# Patient Record
Sex: Male | Born: 1995 | Race: White | Hispanic: No | Marital: Married | Smoking: Former smoker
Health system: Southern US, Community
[De-identification: ages and names within clinical notes are randomized; demographics above are authoritative.]

## PROBLEM LIST (undated history)

## (undated) DIAGNOSIS — K219 Gastro-esophageal reflux disease without esophagitis: Secondary | ICD-10-CM

## (undated) HISTORY — DX: Gastro-esophageal reflux disease without esophagitis: K21.9

---
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
## 2019-02-01 ENCOUNTER — Other Ambulatory Visit: Payer: Self-pay

## 2019-02-01 ENCOUNTER — Emergency Department
Admission: EM | Admit: 2019-02-01 | Discharge: 2019-02-01 | Disposition: A | Discharge status: Home / Self Care | Payer: Self-pay | Attending: Emergency Medicine | Admitting: Emergency Medicine

## 2019-02-01 DIAGNOSIS — Z5321 Procedure and treatment not carried out due to patient leaving prior to being seen by health care provider: Secondary | ICD-10-CM | POA: Insufficient documentation

## 2019-02-01 DIAGNOSIS — M7918 Myalgia, other site: Secondary | ICD-10-CM | POA: Insufficient documentation
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
## 2021-09-24 ENCOUNTER — Ambulatory Visit (INDEPENDENT_AMBULATORY_CARE_PROVIDER_SITE_OTHER): Payer: Managed Care, Other (non HMO)

## 2021-09-24 ENCOUNTER — Ambulatory Visit
Admission: RE | Admit: 2021-09-24 | Discharge: 2021-09-24 | Disposition: A | Discharge status: Home / Self Care | Payer: Managed Care, Other (non HMO) | Source: Ambulatory Visit | Attending: Physician Assistant | Admitting: Physician Assistant

## 2021-09-24 VITALS — BP 126/90 | HR 85 | Temp 98.6°F | Ht 73.0 in | Wt 220.0 lb

## 2021-09-24 DIAGNOSIS — J029 Acute pharyngitis, unspecified: Secondary | ICD-10-CM | POA: Diagnosis not present

## 2021-09-24 DIAGNOSIS — R059 Cough, unspecified: Secondary | ICD-10-CM

## 2021-09-24 DIAGNOSIS — R051 Acute cough: Secondary | ICD-10-CM

## 2021-09-24 DIAGNOSIS — J209 Acute bronchitis, unspecified: Secondary | ICD-10-CM | POA: Diagnosis not present

## 2021-09-24 LAB — GROUP A STREP BY PCR: Group A Strep by PCR: NOT DETECTED

## 2021-09-24 MED ORDER — PREDNISONE 20 MG PO TABS: 40.0000 mg | ORAL_TABLET | Freq: Every day | ORAL | 0 refills | Status: AC

## 2021-09-24 NOTE — ED Provider Notes (Signed)
[facility]    CSN: [id number] Arrival date & time: 09/24/21  1548      History   Chief Complaint Chief Complaint  Patient presents with   Sore Throat    HPI Robert Lopez is a 26 y.o. male presenting for approximately 2-week history of productive cough.  He denies associated fever, chest pain or shortness of breath.  Denies nasal congestion.  States that he woke up today with a sore throat.  He says that bronchitis has been going around his workplace.  He has taken DayQuil today but no other medicines.  He is a current every day smoker.  No other complaints.  HPI  History reviewed. No pertinent past medical history.  There are no problems to display for this patient.   History reviewed. No pertinent surgical history.     Home Medications    Prior to Admission medications   Medication Sig Start Date End Date Taking? Authorizing Provider  predniSONE (DELTASONE) 20 MG tablet Take 2 tablets (40 mg total) by mouth daily for 5 days. 09/24/21 09/29/21 Yes Shirlee Latch, PA-C    Family History History reviewed. No pertinent family history.  Social History Social History   Tobacco Use   Smoking status: Every Day    Types: Cigarettes   Smokeless tobacco: Never  Vaping Use   Vaping Use: Never used  Substance Use Topics   Alcohol use: Yes   Drug use: Never     Allergies   Ibuprofen   Review of Systems Review of Systems  Constitutional:  Negative for fatigue and fever.  HENT:  Positive for congestion and sore throat. Negative for rhinorrhea, sinus pressure and sinus pain.   Respiratory:  Positive for cough. Negative for shortness of breath.   Gastrointestinal:  Negative for abdominal pain, diarrhea, nausea and vomiting.  Musculoskeletal:  Negative for myalgias.  Neurological:  Negative for weakness, light-headedness and headaches.  Hematological:  Negative for adenopathy.     Physical Exam Triage Vital Signs ED Triage Vitals  Enc Vitals  Group     BP 09/24/21 1604 126/90     Pulse Rate 09/24/21 1604 85     Resp --      Temp 09/24/21 1604 98.6 F (37 C)     Temp Source 09/24/21 1604 Oral     SpO2 09/24/21 1604 95 %     Weight 09/24/21 1602 220 lb (99.8 kg)     Height 09/24/21 1602 6\' 1"  (1.854 m)     Head Circumference --      Peak Flow --      Pain Score 09/24/21 1602 4     Pain Loc --      Pain Edu? --      Excl. in [place]? --    No data found.  Updated Vital Signs BP 126/90 (BP Location: Left Arm)   Pulse 85   Temp 98.6 F (37 C) (Oral)   Ht 6\' 1"  (1.854 m)   Wt 220 lb (99.8 kg)   SpO2 95%   BMI 29.03 kg/m      Physical Exam Vitals and nursing note reviewed.  Constitutional:      General: He is not in acute distress.    Appearance: Normal appearance. He is well-developed. He is not ill-appearing.  HENT:     Head: Normocephalic and atraumatic.     Nose: Nose normal.     Mouth/Throat:     Mouth: Mucous membranes are moist.  Pharynx: Oropharynx is clear. Posterior oropharyngeal erythema present.  Eyes:     General: No scleral icterus.    Conjunctiva/sclera: Conjunctivae normal.  Cardiovascular:     Rate and Rhythm: Normal rate and regular rhythm.     Heart sounds: Normal heart sounds.  Pulmonary:     Effort: Pulmonary effort is normal. No respiratory distress.     Breath sounds: Wheezing and rhonchi present.     Comments: Wheezing and rhonchi throughout right lower and middle lung fields. Musculoskeletal:     Cervical back: Neck supple.  Skin:    General: Skin is warm and dry.     Capillary Refill: Capillary refill takes less than 2 seconds.  Neurological:     General: No focal deficit present.     Mental Status: He is alert. Mental status is at baseline.     Motor: No weakness.     Coordination: Coordination normal.  Psychiatric:        Mood and Affect: Mood normal.        Behavior: Behavior normal.      UC Treatments / Results  Labs (all labs ordered are listed, but only abnormal  results are displayed) Labs Reviewed  GROUP A STREP BY PCR    EKG   Radiology DG Chest 2 View  Result Date: 09/24/2021 CLINICAL DATA:  Cough for 2 weeks. EXAM: CHEST - 2 VIEW COMPARISON:  Chest two views 10/28/2008 FINDINGS: Cardiac silhouette and mediastinal contours are within normal limits. The lungs are clear. No pleural effusion or pneumothorax. No acute skeletal abnormality. IMPRESSION: No active cardiopulmonary disease. Electronically Signed   By: Neita Garnet M.D.   On: 09/24/2021 17:08    Procedures Procedures (including critical care time)  Medications Ordered in UC Medications - No data to display  Initial Impression / Assessment and Plan / UC Course  I have reviewed the triage vital signs and the nursing notes.  Pertinent labs & imaging results that were available during my care of the patient were reviewed by me and considered in my medical decision making (see chart for details).  26 year old male presenting for productive cough for the past 2 weeks.  Reports sore throat that began today.  Vitals normal and stable.  Patient is overall well-appearing.  On exam he has erythema posterior pharynx that is mild and wheezing and rhonchi throughout right middle and lower lung fields.  No respiratory distress.  PCR strep test performed and negative.  Chest x-ray obtained to assess for possible underlying pneumonia.  X-ray negative.  Discussed result with patient.  Advised him he likely has bronchitis which is generally due to a virus.  We discussed supportive care and him starting Mucinex and increasing fluid intake.  Sent prednisone to pharmacy.  Reviewed return precautions.   Final Clinical Impressions(s) / UC Diagnoses   Final diagnoses:  Acute bronchitis, unspecified organism  Sore throat  Acute cough     Discharge Instructions      -Negative strep - X-ray does not show any evidence of pneumonia.  Exam consistent with bronchitis.  I have advised to start using  Mucinex over-the-counter and increase fluid intake.  I sent prednisone to help as well.  You should be feeling better over the next week but if you develop fever or start to feel short of breath you should return for reevaluation.     ED Prescriptions     Medication Sig Dispense Auth. Provider   predniSONE (DELTASONE) 20 MG tablet Take 2 tablets (  40 mg total) by mouth daily for 5 days. 10 tablet Gareth Morgan      PDMP not reviewed this encounter.   Shirlee Latch, PA-C 09/24/21 1735

## 2021-09-24 NOTE — Discharge Instructions (Addendum)
-  Negative strep - X-ray does not show any evidence of pneumonia.  Exam consistent with bronchitis.  I have advised to start using Mucinex over-the-counter and increase fluid intake.  I sent prednisone to help as well.  You should be feeling better over the next week but if you develop fever or start to feel short of breath you should return for reevaluation.

## 2021-09-24 NOTE — ED Triage Notes (Addendum)
Patient presents to UC for sore throat this AM. Patient denies any fevers.  Patient stated that a lot of people at his work has bronchitis.

## 2021-12-18 ENCOUNTER — Encounter: Payer: Self-pay | Admitting: Emergency Medicine

## 2021-12-18 ENCOUNTER — Ambulatory Visit (INDEPENDENT_AMBULATORY_CARE_PROVIDER_SITE_OTHER): Payer: Managed Care, Other (non HMO)

## 2021-12-18 ENCOUNTER — Ambulatory Visit
Admission: EM | Admit: 2021-12-18 | Discharge: 2021-12-18 | Disposition: A | Discharge status: Home / Self Care | Payer: Managed Care, Other (non HMO) | Attending: Physician Assistant | Admitting: Physician Assistant

## 2021-12-18 ENCOUNTER — Other Ambulatory Visit: Payer: Self-pay

## 2021-12-18 DIAGNOSIS — M25571 Pain in right ankle and joints of right foot: Secondary | ICD-10-CM

## 2021-12-18 DIAGNOSIS — S99911A Unspecified injury of right ankle, initial encounter: Secondary | ICD-10-CM

## 2021-12-18 DIAGNOSIS — S99921A Unspecified injury of right foot, initial encounter: Secondary | ICD-10-CM | POA: Diagnosis not present

## 2021-12-18 DIAGNOSIS — M79671 Pain in right foot: Secondary | ICD-10-CM

## 2021-12-18 DIAGNOSIS — S9031XA Contusion of right foot, initial encounter: Secondary | ICD-10-CM | POA: Diagnosis not present

## 2021-12-18 MED ORDER — PREDNISONE 20 MG PO TABS: 20.0000 mg | ORAL_TABLET | Freq: Every day | ORAL | 0 refills | Status: AC

## 2021-12-18 NOTE — ED Provider Notes (Signed)
[facility]    CSN: [id number] Arrival date & time: 12/18/21  0830      History   Chief Complaint Chief Complaint  Patient presents with   Foot Injury    Right    HPI Robert Lopez is a 26 y.o. male presenting for right foot pain since yesterday.  Patient reports he was at work and dropped a metal exhaust pipe on his foot.  He reports a lot of swelling.  States that his child also ran into his leg and made things worse yesterday.  He reports increased pain when he tries to walk and bear weight.  He states the pain radiates up to his ankle and calf.  No numbness or tingling reported.  No falls.  He has not been treating condition in any way.  He does not report any previous major injury or fracture of this foot in the past.  He would like a note for work for today and tomorrow.  No other complaints.  HPI  History reviewed. No pertinent past medical history.  There are no problems to display for this patient.   History reviewed. No pertinent surgical history.     Home Medications    Prior to Admission medications   Medication Sig Start Date End Date Taking? Authorizing Provider  predniSONE (DELTASONE) 20 MG tablet Take 1 tablet (20 mg total) by mouth daily for 5 days. 12/18/21 12/23/21 Yes Danton Clap, PA-C    Family History No family history on file.  Social History Social History   Tobacco Use   Smoking status: Every Day    Types: Cigarettes   Smokeless tobacco: Never  Vaping Use   Vaping Use: Never used  Substance Use Topics   Alcohol use: Yes   Drug use: Never     Allergies   Ibuprofen   Review of Systems Review of Systems  Musculoskeletal:  Positive for arthralgias and joint swelling. Negative for gait problem.  Skin:  Negative for color change and wound.  Neurological:  Negative for weakness and numbness.     Physical Exam Triage Vital Signs ED Triage Vitals  Enc Vitals Group     BP 12/18/21 0856 110/81     Pulse Rate  12/18/21 0856 72     Resp 12/18/21 0856 16     Temp 12/18/21 0856 98.1 F (36.7 C)     Temp Source 12/18/21 0856 Oral     SpO2 12/18/21 0856 100 %     Weight 12/18/21 0855 220 lb 0.3 oz (99.8 kg)     Height 12/18/21 0855 6\' 1"  (1.854 m)     Head Circumference --      Peak Flow --      Pain Score 12/18/21 0854 5     Pain Loc --      Pain Edu? --      Excl. in [place]? --    No data found.  Updated Vital Signs BP 110/81 (BP Location: Left Arm)   Pulse 72   Temp 98.1 F (36.7 C) (Oral)   Resp 16   Ht 6\' 1"  (1.854 m)   Wt 220 lb 0.3 oz (99.8 kg)   SpO2 100%   BMI 29.03 kg/m       Physical Exam Vitals and nursing note reviewed.  Constitutional:      General: He is not in acute distress.    Appearance: Normal appearance. He is well-developed. He is not ill-appearing.  HENT:  Head: Normocephalic and atraumatic.  Eyes:     General: No scleral icterus.    Conjunctiva/sclera: Conjunctivae normal.  Cardiovascular:     Rate and Rhythm: Normal rate and regular rhythm.     Pulses: Normal pulses.  Pulmonary:     Effort: Pulmonary effort is normal. No respiratory distress.  Musculoskeletal:     Cervical back: Neck supple.     Right ankle: Normal. No swelling. No tenderness. Normal range of motion.     Right foot: Normal range of motion and normal capillary refill. Swelling (mild/moderate swelling with faint ecchymosis dorsal foot) and tenderness (diffuse TTP along metatarsals 1-3) present. Normal pulse.  Skin:    General: Skin is warm and dry.     Capillary Refill: Capillary refill takes less than 2 seconds.  Neurological:     General: No focal deficit present.     Mental Status: He is alert. Mental status is at baseline.     Motor: No weakness.     Gait: Gait abnormal.  Psychiatric:        Mood and Affect: Mood normal.        Behavior: Behavior normal.      UC Treatments / Results  Labs (all labs ordered are listed, but only abnormal results are displayed) Labs  Reviewed - No data to display  EKG   Radiology DG Foot Complete Right  Result Date: 12/18/2021 CLINICAL DATA:  Dropped exhaust pipe on foot yesterday EXAM: RIGHT FOOT COMPLETE - 3+ VIEW; RIGHT ANKLE - COMPLETE 3+ VIEW COMPARISON:  None Available. FINDINGS: There is no evidence of fracture or dislocation. There is no evidence of arthropathy or other focal bone abnormality. Soft tissues are unremarkable. IMPRESSION: No fracture or dislocation of the right foot or ankle. Joint spaces are well preserved. Electronically Signed   By: Jearld Lesch M.D.   On: 12/18/2021 09:20   DG Ankle Complete Right  Result Date: 12/18/2021 CLINICAL DATA:  Dropped exhaust pipe on foot yesterday EXAM: RIGHT FOOT COMPLETE - 3+ VIEW; RIGHT ANKLE - COMPLETE 3+ VIEW COMPARISON:  None Available. FINDINGS: There is no evidence of fracture or dislocation. There is no evidence of arthropathy or other focal bone abnormality. Soft tissues are unremarkable. IMPRESSION: No fracture or dislocation of the right foot or ankle. Joint spaces are well preserved. Electronically Signed   By: Jearld Lesch M.D.   On: 12/18/2021 09:20    Procedures Procedures (including critical care time)  Medications Ordered in UC Medications - No data to display  Initial Impression / Assessment and Plan / UC Course  I have reviewed the triage vital signs and the nursing notes.  Pertinent labs & imaging results that were available during my care of the patient were reviewed by me and considered in my medical decision making (see chart for details).   26 year old male presenting for right foot pain and swelling after dropping an exhaust pipe on his foot at work yesterday.  X-rays of foot and ankle obtained today show no acute fractures.  Discussed result with patient.  Suspect contusion/crush injury without any underlying ligamentous tears.  Patient has allergy to ibuprofen and NSAIDs.  Sent low-dose prednisone to help with the swelling and  inflammation.  Also encouraged use of Tylenol, topical diclofenac gel, ice and elevation.  Patient also provided with a lace up ankle/foot brace.  Work note provided.  Advised to follow-up as needed.   Final Clinical Impressions(s) / UC Diagnoses   Final diagnoses:  Contusion of right  foot, initial encounter  Injury of right foot, initial encounter  Injury of right ankle, initial encounter     Discharge Instructions      -Your x-rays are normal.  Nothing is broken.  PAIN: Stressed avoiding painful activities . Reviewed RICE guidelines. Use medications as directed, including prednisone and Tylenol. Try topical Voltaren gel.  If no improvement in the next 1-2 weeks, f/u with PCP or return to our office for reexamination, and please feel free to call or return at any time for any questions or concerns you may have and we will be happy to help you!         ED Prescriptions     Medication Sig Dispense Auth. Provider   predniSONE (DELTASONE) 20 MG tablet Take 1 tablet (20 mg total) by mouth daily for 5 days. 5 tablet Gareth Morgan      PDMP not reviewed this encounter.   Shirlee Latch, PA-C 12/18/21 [phone]

## 2021-12-18 NOTE — Discharge Instructions (Addendum)
-  Your x-rays are normal.  Nothing is broken.  PAIN: Stressed avoiding painful activities . Reviewed RICE guidelines. Use medications as directed, including prednisone and Tylenol. Try topical Voltaren gel.  If no improvement in the next 1-2 weeks, f/u with PCP or return to our office for reexamination, and please feel free to call or return at any time for any questions or concerns you may have and we will be happy to help you!

## 2021-12-18 NOTE — ED Triage Notes (Signed)
Pt c/o right foot pain. He states he dropped a metal exhaust pip on his yesterday at work. He has swelling on top of his foot. He states when he walks the pain radiates into his ankle and up his calf.

## 2022-09-06 ENCOUNTER — Encounter: Payer: Self-pay | Admitting: Physician Assistant

## 2022-09-06 ENCOUNTER — Ambulatory Visit: Payer: Managed Care, Other (non HMO) | Admitting: Physician Assistant

## 2022-09-06 VITALS — BP 100/80 | HR 87 | Temp 98.2°F | Ht 73.0 in | Wt 243.0 lb

## 2022-09-06 DIAGNOSIS — K219 Gastro-esophageal reflux disease without esophagitis: Secondary | ICD-10-CM | POA: Diagnosis not present

## 2022-09-06 DIAGNOSIS — F17201 Nicotine dependence, unspecified, in remission: Secondary | ICD-10-CM | POA: Insufficient documentation

## 2022-09-06 DIAGNOSIS — K029 Dental caries, unspecified: Secondary | ICD-10-CM | POA: Insufficient documentation

## 2022-09-06 DIAGNOSIS — G5691 Unspecified mononeuropathy of right upper limb: Secondary | ICD-10-CM | POA: Insufficient documentation

## 2022-09-06 DIAGNOSIS — F17200 Nicotine dependence, unspecified, uncomplicated: Secondary | ICD-10-CM | POA: Diagnosis not present

## 2022-09-06 DIAGNOSIS — K089 Disorder of teeth and supporting structures, unspecified: Secondary | ICD-10-CM

## 2022-09-06 DIAGNOSIS — F1020 Alcohol dependence, uncomplicated: Secondary | ICD-10-CM

## 2022-09-06 DIAGNOSIS — F1010 Alcohol abuse, uncomplicated: Secondary | ICD-10-CM | POA: Insufficient documentation

## 2022-09-06 DIAGNOSIS — M439 Deforming dorsopathy, unspecified: Secondary | ICD-10-CM | POA: Insufficient documentation

## 2022-09-06 MED ORDER — OMEPRAZOLE 20 MG PO CPDR: 20.0000 mg | DELAYED_RELEASE_CAPSULE | Freq: Two times a day (BID) | ORAL | 2 refills | Status: DC

## 2022-09-06 NOTE — Patient Instructions (Addendum)
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
## 2022-09-06 NOTE — Assessment & Plan Note (Signed)
Discussed with patient today, he knows he needs to quit but is not quite ready.  2.5 pack/day smoking at this age likely to result in significant deterioration of health outcomes long-term.  I suspect some degree of lung damage has already occurred based on his exam and complaint of dyspnea on exertion.  Discussed option for NRT and/or pharmacotherapy when he is ready.

## 2022-09-06 NOTE — Assessment & Plan Note (Signed)
Strongly encouraged reduction with eventual cessation, almost definitely contributing to GERD.  Heavy drinking necessitates LFTs next visit.

## 2022-09-06 NOTE — Assessment & Plan Note (Signed)
Strongly encouraged twice daily brushing with daily flossing.  Continue routine care with dentistry.  Alcohol and smoking cessation would also help with this problem. Reminded patient we only get one set of adult teeth and he should try to preserve them as much as possible. When it comes time for nicotine replacement therapy, the state of his dentition will exclude him from gum as a viable option.

## 2022-09-06 NOTE — Assessment & Plan Note (Signed)
Start omeprazole 20 mg twice daily.  Discussed etiology of GERD and likely relationship to excess alcohol consumption and/or smoking.  Today's exam reassuring.  If not resolving with PPI, consider GI referral given relatively increased risk of alternate pathology including Barrett's, varices, malignancy.

## 2022-10-21 ENCOUNTER — Encounter: Payer: Managed Care, Other (non HMO) | Admitting: Physician Assistant

## 2022-11-04 ENCOUNTER — Encounter: Payer: Self-pay | Admitting: Physician Assistant

## 2022-11-04 ENCOUNTER — Ambulatory Visit (INDEPENDENT_AMBULATORY_CARE_PROVIDER_SITE_OTHER): Payer: Managed Care, Other (non HMO) | Admitting: Physician Assistant

## 2022-11-04 VITALS — BP 134/96 | HR 87 | Ht 73.0 in | Wt 240.0 lb

## 2022-11-04 DIAGNOSIS — F17200 Nicotine dependence, unspecified, uncomplicated: Secondary | ICD-10-CM | POA: Diagnosis not present

## 2022-11-04 DIAGNOSIS — Z23 Encounter for immunization: Secondary | ICD-10-CM | POA: Diagnosis not present

## 2022-11-04 DIAGNOSIS — Z1159 Encounter for screening for other viral diseases: Secondary | ICD-10-CM

## 2022-11-04 DIAGNOSIS — F1020 Alcohol dependence, uncomplicated: Secondary | ICD-10-CM

## 2022-11-04 DIAGNOSIS — Z0000 Encounter for general adult medical examination without abnormal findings: Secondary | ICD-10-CM

## 2022-11-04 DIAGNOSIS — K219 Gastro-esophageal reflux disease without esophagitis: Secondary | ICD-10-CM | POA: Diagnosis not present

## 2022-11-04 DIAGNOSIS — Z114 Encounter for screening for human immunodeficiency virus [HIV]: Secondary | ICD-10-CM

## 2022-11-04 NOTE — Progress Notes (Signed)
Date:  11/04/2022   Name:  Robert Lopez   DOB:  07-13-1995   MRN:  [id number]   Chief Complaint: Annual Exam  HPI Robert Lopez returns to clinic for complete physical exam today in need of baseline labs.    At her last visit we discussed his quite excessive smoking and alcohol use, which he has been working on over the last couple of months and has made good progress recently.  Now smoking 0.25 ppd over the last week compared to 2.5 ppd previously. Alcohol only on weekends now, 8-12 beers each day of the weekend.  Unfortunately does still admit to staying up into the night, and not always brushing his teeth every day. GERD has improved with alcohol reduction, only using omeprazole PRN.   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  Current Meds  Medication Sig   omeprazole (PRILOSEC) 20 MG capsule Take 1 capsule (20 mg total) by mouth 2 (two) times daily before a meal.     Review of Systems  Constitutional:  Negative for activity change, appetite change, fatigue and unexpected weight change.  HENT:  Positive for dental problem. Negative for hearing loss and trouble swallowing.   Eyes:  Positive for visual disturbance.  Respiratory:  Negative for cough, chest tightness, shortness of breath and wheezing.   Cardiovascular:  Negative for chest pain, palpitations and leg swelling.  Gastrointestinal:  Negative for abdominal distention, abdominal pain, blood in stool, constipation and diarrhea.  Endocrine: Negative for polydipsia, polyphagia and polyuria.  Genitourinary:  Negative for difficulty urinating, dysuria, enuresis, frequency, hematuria and testicular pain.  Musculoskeletal:  Negative for arthralgias, gait problem and joint swelling.  Skin:  Negative for rash and wound.  Neurological:  Negative for dizziness, syncope,  weakness and headaches.  Psychiatric/Behavioral:  Negative for behavioral problems and sleep disturbance.     Patient Active Problem List   Diagnosis Date Noted   Spinal curvature 09/06/2022   Dental caries 09/06/2022   Gastroesophageal reflux disease 09/06/2022   Tobacco use disorder, severe, dependence 09/06/2022   Alcohol use disorder, severe, dependence (HCC) 09/06/2022   Poor dentition 09/06/2022   Neuropathy, arm, right 09/06/2022   Back pain 01/03/2013   Tobacco abuse 06/22/2012    Allergies  Allergen Reactions   Ibuprofen Other (See Comments)    seizures    Immunization History  Administered Date(s) Administered   Tdap 11/04/2022    History reviewed. No pertinent surgical history.  Social History   Tobacco Use   Smoking status: Every Day    Current packs/day: 2.50    Average packs/day: 2.5 packs/day for 12.0 years (30.0 ttl pk-yrs)    Types: Cigarettes   Smokeless tobacco: Never  Vaping Use   Vaping status: Some Days   Start date: 04/09/2019  Substance Use Topics   Alcohol use: Yes    Alcohol/week: 75.0 - 100.0 standard drinks of alcohol    Types: 75 - 100 Cans of beer per week    Comment: 12 pack most weekdays, 24 most weekend days.   Drug use: Never    Family History  Problem Relation Age of Onset   Stroke Mother    Hypertension Paternal Grandfather    Diabetes Paternal Grandfather         11/04/2022    2:48 PM 09/06/2022  3:02 PM  GAD 7 : Generalized Anxiety Score  Nervous, Anxious, on Edge 0 0  Control/stop worrying 0 0  Worry too much - different things 0 0  Trouble relaxing 0 0  Restless 0 0  Easily annoyed or irritable 0 1  Afraid - awful might happen 0 0  Total GAD 7 Score 0 1  Anxiety Difficulty Not difficult at all Not difficult at all       11/04/2022    2:48 PM 09/06/2022    3:02 PM  Depression screen PHQ 2/9  Decreased Interest 1 0  Down, Depressed, Hopeless 0 0  PHQ - 2 Score 1 0  Altered sleeping 0 0  Tired, decreased  energy 1 1  Change in appetite 0 0  Feeling bad or failure about yourself  0 0  Trouble concentrating 0 0  Moving slowly or fidgety/restless 0 0  Suicidal thoughts 0 0  PHQ-9 Score 2 1  Difficult doing work/chores Not difficult at all Not difficult at all    BP Readings from Last 3 Encounters:  11/04/22 (!) 134/96  09/06/22 100/80  12/18/21 110/81    Wt Readings from Last 3 Encounters:  11/04/22 240 lb (108.9 kg)  09/06/22 243 lb (110.2 kg)  12/18/21 220 lb 0.3 oz (99.8 kg)    BP (!) 134/96   Pulse 87   Ht 6\' 1"  (1.854 m)   Wt 240 lb (108.9 kg)   SpO2 96%   BMI 31.66 kg/m   Physical Exam Vitals and nursing note reviewed.  Constitutional:      Appearance: Normal appearance.     Comments: Patient appears older than stated age. Clothes, skin, and hands visibly soiled presumably from his work.  HENT:     Ears:     Comments: EAC clear bilaterally with good view of TM which is without effusion or erythema.     Nose: Nose normal.     Mouth/Throat:     Mouth: Mucous membranes are moist. No oral lesions.     Dentition: Normal dentition. No dental tenderness.     Tongue: No lesions.     Pharynx: No posterior oropharyngeal erythema.      Comments: Overall very poor dentition. Several teeth are broken, many are missing, deep caries noted, diffuse tobacco staining, malalignment.  Eyes:     Extraocular Movements: Extraocular movements intact.     Conjunctiva/sclera: Conjunctivae normal.     Pupils: Pupils are equal, round, and reactive to light.  Neck:     Thyroid: No thyromegaly.     Vascular: No carotid bruit.  Cardiovascular:     Rate and Rhythm: Normal rate and regular rhythm.     Heart sounds: No murmur heard.    No friction rub. No gallop.     Comments: Pulses 2+ at radial, PT, DP bilaterally. No carotid bruit. No peripheral edema Pulmonary:     Effort: Pulmonary effort is normal.     Breath sounds: Normal breath sounds. No wheezing, rhonchi or rales.  Abdominal:      General: Abdomen is flat. Bowel sounds are normal. There is no distension.     Palpations: Abdomen is soft. There is no hepatomegaly or mass.     Tenderness: There is no abdominal tenderness.  Genitourinary:    Comments: Deferred. Reviewed self-exam technique Musculoskeletal:     Comments: Full ROM with strength 5/5 bilateral upper and lower extremities  Lymphadenopathy:     Cervical: No cervical adenopathy.  Skin:  General: Skin is warm.     Capillary Refill: Capillary refill takes less than 2 seconds.     Findings: No lesion or rash.  Neurological:     Mental Status: He is alert and oriented to person, place, and time.     Gait: Gait is intact.  Psychiatric:        Mood and Affect: Mood normal.        Behavior: Behavior normal.     Vision Screening   Right eye Left eye Both eyes  Without correction 20/20- 20/40- 20/20  With correction       Recent Labs  No results found for: "NA", "K", "CL", "CO2", "GLUCOSE", "BUN", "CREATININE", "CALCIUM", "PROT", "ALBUMIN", "AST", "ALT", "ALKPHOS", "BILITOT", "GFRNONAA", "GFRAA"  No results found for: "WBC", "HGB", "HCT", "MCV", "PLT" No results found for: "HGBA1C" No results found for: "CHOL", "HDL", "LDLCALC", "LDLDIRECT", "TRIG", "CHOLHDL" No results found for: "TSH"   Assessment and Plan:  1. Annual physical exam Overall, Robert Lopez has made some great strides to improving his health.  Congratulated on this and reminded there is still work to be done.  Obtaining baseline labs today.  We discussed his vision screening results today which reveal some vision correction may be desired to optimize visual acuity, with left eye seeing 20/40- uncorrected.  He intends to use his vision insurance for routine eye exam which I think is a great idea  - CBC with Differential/Platelet - Comprehensive metabolic panel - TSH  2. Tobacco use disorder, severe, dependence Congratulated on significant reduction of cigarette smoking.  Once again  discussed option for pharmacotherapy or NRT, but he will continue working on this habit with eventual goal of complete cessation.  3. Alcohol use disorder, severe, dependence (HCC) Congratulated on sobriety during the weekdays, though he is still aware he indulges to an unhealthy degree on the weekend, especially since he stays up into the early morning hours.  He will continue working on alcohol reduction  4. Gastroesophageal reflux disease, unspecified whether esophagitis present Nearly resolved, likely alcohol induced.  Use omeprazole as needed and continue working on alcohol reduction  5. Need for Tdap vaccination Tdap booster administered today - Tdap vaccine greater than or equal to 7yo IM  6. Need for hepatitis C screening test One-time hep C screen today - Hepatitis C antibody  7. Screening for HIV (human immunodeficiency virus) One-time HIV screen today - HIV Antibody (routine testing w rflx)    Return in about 3 months (around 02/04/2023) for OV f/u smoking.    Alvester Morin, PA-C, DMSc, Nutritionist [facility] Primary Care and Sports Medicine [facility] [phone]

## 2022-11-04 NOTE — Patient Instructions (Signed)
-  It was a pleasure to see you today! Please review your visit summary for helpful information -Lab results are usually available within 1-2 days and we will call once reviewed -I would encourage you to follow your care via MyChart where you can access lab results, notes, messages, and more -If you feel that we did a nice job today, please complete your after-visit survey and leave us a Google review! Your CMA today was Kieandra and your provider was Dan Waddell, PA-C, DMSc -Please return for follow-up in about 3 months  

## 2023-02-10 ENCOUNTER — Ambulatory Visit: Payer: Managed Care, Other (non HMO) | Admitting: Physician Assistant

## 2023-02-10 ENCOUNTER — Encounter: Payer: Self-pay | Admitting: Physician Assistant

## 2023-02-10 VITALS — BP 104/88 | HR 77 | Ht 73.0 in | Wt 248.0 lb

## 2023-02-10 DIAGNOSIS — F1010 Alcohol abuse, uncomplicated: Secondary | ICD-10-CM | POA: Diagnosis not present

## 2023-02-10 DIAGNOSIS — F17201 Nicotine dependence, unspecified, in remission: Secondary | ICD-10-CM

## 2023-02-10 DIAGNOSIS — Z720 Tobacco use: Secondary | ICD-10-CM | POA: Insufficient documentation

## 2023-02-10 DIAGNOSIS — L729 Follicular cyst of the skin and subcutaneous tissue, unspecified: Secondary | ICD-10-CM

## 2023-02-10 NOTE — Progress Notes (Signed)
Date:  02/10/2023   Name:  Robert Lopez   DOB:  1996-01-20   MRN:  [id number]   Chief Complaint: Nicotine Dependence (Quit smoking cigs, still vapes, not as much )  HPI Robert Lopez presents for 58-month follow-up on alcohol and cigarette smoking.    Stopped smoking 3 months ago, currently alternates between a nicotine and non-nicotine vape.  Tries to minimize nicotine use.  Has improved somewhat on drinking habits.  Endorses light drinking Fridays ~2 beers, Saturday is main drinking day usually 8-12 beers.  Does not drink any other days of the week.  Stopped omeprazole because his GERD improved with alcohol reduction.  Mentions a "bump" on the scalp that his wife has been trying to pop for several days, without much luck. It is sore. He says when she put a lot of pressure on it, it leaked purple.   Medication list has been reviewed and updated.  Current Meds  Medication Sig   [DISCONTINUED] omeprazole (PRILOSEC) 20 MG capsule Take 1 capsule (20 mg total) by mouth 2 (two) times daily before a meal.     Review of Systems  Constitutional:  Negative for fatigue and fever.  Respiratory:  Negative for chest tightness and shortness of breath.   Cardiovascular:  Negative for chest pain and palpitations.  Gastrointestinal:  Negative for abdominal pain.    Patient Active Problem List   Diagnosis Date Noted   Spinal curvature 09/06/2022   Dental caries 09/06/2022   Gastroesophageal reflux disease 09/06/2022   Tobacco use disorder, severe, dependence 09/06/2022   Alcohol use disorder, severe, dependence (HCC) 09/06/2022   Poor dentition 09/06/2022   Neuropathy, arm, right 09/06/2022   Back pain 01/03/2013   Tobacco abuse 06/22/2012    Allergies  Allergen Reactions   Ibuprofen Other (See Comments)    seizures    Immunization History  Administered Date(s) Administered   Tdap 11/04/2022    History reviewed. No pertinent surgical history.  Social History   Tobacco Use    Smoking status: Former    Current packs/day: 0.00    Average packs/day: 2.5 packs/day for 12.0 years (30.0 ttl pk-yrs)    Types: Cigarettes    Quit date: 11/10/2022    Years since quitting: 0.2   Smokeless tobacco: Never  Vaping Use   Vaping status: Some Days   Start date: 04/09/2019  Substance Use Topics   Alcohol use: Yes    Alcohol/week: 10.0 - 14.0 standard drinks of alcohol    Types: 10 - 14 Cans of beer per week    Comment: historically very heavy drinker >100 drinks/week   Drug use: Never    Family History  Problem Relation Age of Onset   Stroke Mother    Hypertension Paternal Grandfather    Diabetes Paternal Grandfather         02/10/2023    3:56 PM 11/04/2022    2:48 PM 09/06/2022    3:02 PM  GAD 7 : Generalized Anxiety Score  Nervous, Anxious, on Edge 0 0 0  Control/stop worrying 0 0 0  Worry too much - different things 0 0 0  Trouble relaxing 0 0 0  Restless 0 0 0  Easily annoyed or irritable 0 0 1  Afraid - awful might happen 0 0 0  Total GAD 7 Score 0 0 1  Anxiety Difficulty Not difficult at all Not difficult at all Not difficult at all       02/10/2023    3:56 PM  11/04/2022    2:48 PM 09/06/2022    3:02 PM  Depression screen PHQ 2/9  Decreased Interest 0 1 0  Down, Depressed, Hopeless 0 0 0  PHQ - 2 Score 0 1 0  Altered sleeping 0 0 0  Tired, decreased energy 0 1 1  Change in appetite 0 0 0  Feeling bad or failure about yourself  0 0 0  Trouble concentrating 0 0 0  Moving slowly or fidgety/restless 0 0 0  Suicidal thoughts 0 0 0  PHQ-9 Score 0 2 1  Difficult doing work/chores Not difficult at all Not difficult at all Not difficult at all    BP Readings from Last 3 Encounters:  02/10/23 104/88  11/04/22 (!) 134/96  09/06/22 100/80    Wt Readings from Last 3 Encounters:  02/10/23 248 lb (112.5 kg)  11/04/22 240 lb (108.9 kg)  09/06/22 243 lb (110.2 kg)    BP 104/88   Pulse 77   Ht 6\' 1"  (1.854 m)   Wt 248 lb (112.5 kg)   SpO2 96%   BMI  32.72 kg/m   Physical Exam Vitals and nursing note reviewed.  Constitutional:      Appearance: Normal appearance.  Cardiovascular:     Rate and Rhythm: Normal rate and regular rhythm.     Heart sounds: No murmur heard.    No friction rub. No gallop.  Pulmonary:     Effort: Pulmonary effort is normal.     Breath sounds: Normal breath sounds.  Skin:    Comments: Right anterior scalp with a 10-15 mm cystic mobile lesion slightly tender to palpation but not fluctuant and without active discharge.      Recent Labs     Component Value Date/Time   NA 141 11/04/2022 1521   K 4.1 11/04/2022 1521   CL 102 11/04/2022 1521   CO2 24 11/04/2022 1521   GLUCOSE 86 11/04/2022 1521   BUN 13 11/04/2022 1521   CREATININE 0.93 11/04/2022 1521   CALCIUM 9.7 11/04/2022 1521   PROT 7.1 11/04/2022 1521   ALBUMIN 4.7 11/04/2022 1521   AST 25 11/04/2022 1521   ALT 32 11/04/2022 1521   ALKPHOS 83 11/04/2022 1521   BILITOT 0.3 11/04/2022 1521    Lab Results  Component Value Date   WBC 6.9 11/04/2022   HGB 15.7 11/04/2022   HCT 44.7 11/04/2022   MCV 89 11/04/2022   PLT 234 11/04/2022   No results found for: "HGBA1C" No results found for: "CHOL", "HDL", "LDLCALC", "LDLDIRECT", "TRIG", "CHOLHDL" Lab Results  Component Value Date   TSH 1.610 11/04/2022     Assessment and Plan:  1. Tobacco use disorder, severe, in early remission Congratulated on tobacco cessation, continue to avoid cigarettes  2. Alcohol use disorder, mild, abuse Congratulated on significant progress with alcohol reduction.  Encouraged patient to continue reducing intake especially on the weekend, when he is still drinking an unhealthy amount of alcohol  3. Vapes nicotine containing substance Encouraged eventual cessation of vape  4. Scalp cyst Advised daily shower and warm compress for the next 5 days. Discouraged from attempting to pop unless it is draining readily. Patient to let me know if not improvement.     Return in about 9 months (around 11/07/2023) for nonfasting CPE.    Alvester Morin, PA-C, DMSc, Nutritionist [facility] Primary Care and Sports Medicine [facility] [phone]

## 2023-06-24 ENCOUNTER — Encounter: Payer: Self-pay | Admitting: Physician Assistant

## 2023-06-24 ENCOUNTER — Ambulatory Visit: Admitting: Physician Assistant

## 2023-06-24 VITALS — BP 118/76 | HR 90 | Ht 72.0 in | Wt 248.8 lb

## 2023-06-24 DIAGNOSIS — M25561 Pain in right knee: Secondary | ICD-10-CM

## 2023-06-24 NOTE — Progress Notes (Signed)
 Date:  06/24/2023   Name:  Robert Lopez   DOB:  June 17, 1995   MRN:  [id number]   Chief Complaint: Knee Pain (Right knee pain. Pt reports slipping and falling on dirt Saturday about 2:30-3:00 am. Reports it didn't hurt at that time but when he woke up about 8:00 am pain was worse. Reports pain is 6-8/10. Pain radiates to calf muscle and thigh describing it as a shooting pulsating pain. Pt does not take any form of treatment. No bruising)  HPI Ray presents for evaluation of 4 days acute right knee pain after slipping in mud while intoxicated this weekend. Took BC powder Saturday morning but no medication since. States he is allergic to ibuprofen but interestingly can tolerate the aspirin in [facility] powder. He continues to ambulate with much difficulty, but has pulsating right knee pain.    Medication list has been reviewed and updated.  No outpatient medications have been marked as taking for the 06/24/23 encounter (Office Visit) with Remo Lipps, PA.     Review of Systems  Patient Active Problem List   Diagnosis Date Noted   Vapes nicotine containing substance 02/10/2023   Spinal curvature 09/06/2022   Dental caries 09/06/2022   Gastroesophageal reflux disease 09/06/2022   Tobacco use disorder, severe, in early remission 09/06/2022   Alcohol use disorder, mild, abuse 09/06/2022   Poor dentition 09/06/2022   Neuropathy, arm, right 09/06/2022   Back pain 01/03/2013    Allergies  Allergen Reactions   Ibuprofen Other (See Comments)    seizures    Immunization History  Administered Date(s) Administered   Tdap 11/04/2022    No past surgical history on file.  Social History   Tobacco Use   Smoking status: Former    Current packs/day: 0.00    Average packs/day: 2.5 packs/day for 12.0 years (30.0 ttl pk-yrs)    Types: Cigarettes    Quit date: 11/10/2022    Years since quitting: 0.6   Smokeless tobacco: Never  Vaping Use   Vaping status: Some Days   Start date:  04/09/2019  Substance Use Topics   Alcohol use: Yes    Alcohol/week: 10.0 - 14.0 standard drinks of alcohol    Types: 10 - 14 Cans of beer per week    Comment: historically very heavy drinker >100 drinks/week   Drug use: Never    Family History  Problem Relation Age of Onset   Stroke Mother    Hypertension Paternal Grandfather    Diabetes Paternal Grandfather         02/10/2023    3:56 PM 11/04/2022    2:48 PM 09/06/2022    3:02 PM  GAD 7 : Generalized Anxiety Score  Nervous, Anxious, on Edge 0 0 0  Control/stop worrying 0 0 0  Worry too much - different things 0 0 0  Trouble relaxing 0 0 0  Restless 0 0 0  Easily annoyed or irritable 0 0 1  Afraid - awful might happen 0 0 0  Total GAD 7 Score 0 0 1  Anxiety Difficulty Not difficult at all Not difficult at all Not difficult at all       02/10/2023    3:56 PM 11/04/2022    2:48 PM 09/06/2022    3:02 PM  Depression screen PHQ 2/9  Decreased Interest 0 1 0  Down, Depressed, Hopeless 0 0 0  PHQ - 2 Score 0 1 0  Altered sleeping 0 0 0  Tired, decreased energy  0 1 1  Change in appetite 0 0 0  Feeling bad or failure about yourself  0 0 0  Trouble concentrating 0 0 0  Moving slowly or fidgety/restless 0 0 0  Suicidal thoughts 0 0 0  PHQ-9 Score 0 2 1  Difficult doing work/chores Not difficult at all Not difficult at all Not difficult at all    BP Readings from Last 3 Encounters:  06/24/23 118/76  02/10/23 104/88  11/04/22 (!) 134/96    Wt Readings from Last 3 Encounters:  06/24/23 248 lb 12.8 oz (112.9 kg)  02/10/23 248 lb (112.5 kg)  11/04/22 240 lb (108.9 kg)    BP 118/76 (BP Location: Left Arm, Patient Position: Sitting, Cuff Size: Normal)   Pulse 90   Ht 6' (1.829 m)   Wt 248 lb 12.8 oz (112.9 kg)   SpO2 96%   BMI 33.74 kg/m   Physical Exam Vitals and nursing note reviewed.  Constitutional:      Appearance: Normal appearance.  Cardiovascular:     Rate and Rhythm: Normal rate.  Pulmonary:     Effort:  Pulmonary effort is normal.  Abdominal:     General: There is no distension.  Musculoskeletal:        General: Normal range of motion.     Comments: Normal right knee exam without erythema, edema, effusion, or ecchymosis. Full AROM with normal gait. Negative anterior/posterior drawer. Mild pain with varus and valgus stress. Joint stability maintained.   Skin:    General: Skin is warm and dry.  Neurological:     Mental Status: He is alert and oriented to person, place, and time.     Gait: Gait is intact.  Psychiatric:        Mood and Affect: Mood and affect normal.     Recent Labs     Component Value Date/Time   NA 141 11/04/2022 1521   K 4.1 11/04/2022 1521   CL 102 11/04/2022 1521   CO2 24 11/04/2022 1521   GLUCOSE 86 11/04/2022 1521   BUN 13 11/04/2022 1521   CREATININE 0.93 11/04/2022 1521   CALCIUM 9.7 11/04/2022 1521   PROT 7.1 11/04/2022 1521   ALBUMIN 4.7 11/04/2022 1521   AST 25 11/04/2022 1521   ALT 32 11/04/2022 1521   ALKPHOS 83 11/04/2022 1521   BILITOT 0.3 11/04/2022 1521    Lab Results  Component Value Date   WBC 6.9 11/04/2022   HGB 15.7 11/04/2022   HCT 44.7 11/04/2022   MCV 89 11/04/2022   PLT 234 11/04/2022   No results found for: "HGBA1C" No results found for: "CHOL", "HDL", "LDLCALC", "LDLDIRECT", "TRIG", "CHOLHDL" Lab Results  Component Value Date   TSH 1.610 11/04/2022     Assessment and Plan:  1. Acute pain of right knee (Primary) Patient reassured low suspicion for any serious injury.  Advised topical diclofenac up to 4 times daily, which I doubt will have any cross-reactivity with his ibuprofen allergy.  If he would like to be extra cautious, can do a spot test on a small area of the skin to see if he reacts.   F/u PRN   Alvester Morin, PA-C, DMSc, Nutritionist [facility] Primary Care and Sports Medicine [facility] [phone]

## 2023-08-09 IMAGING — CR DG CHEST 2V
3 series · 3 of 3 positions shown · non-contrast
Comparison: Chest two views 10/28/2008

CLINICAL DATA: Cough for 2 weeks.

EXAM:
CHEST - 2 VIEW

[chest pa (1 of 2)]
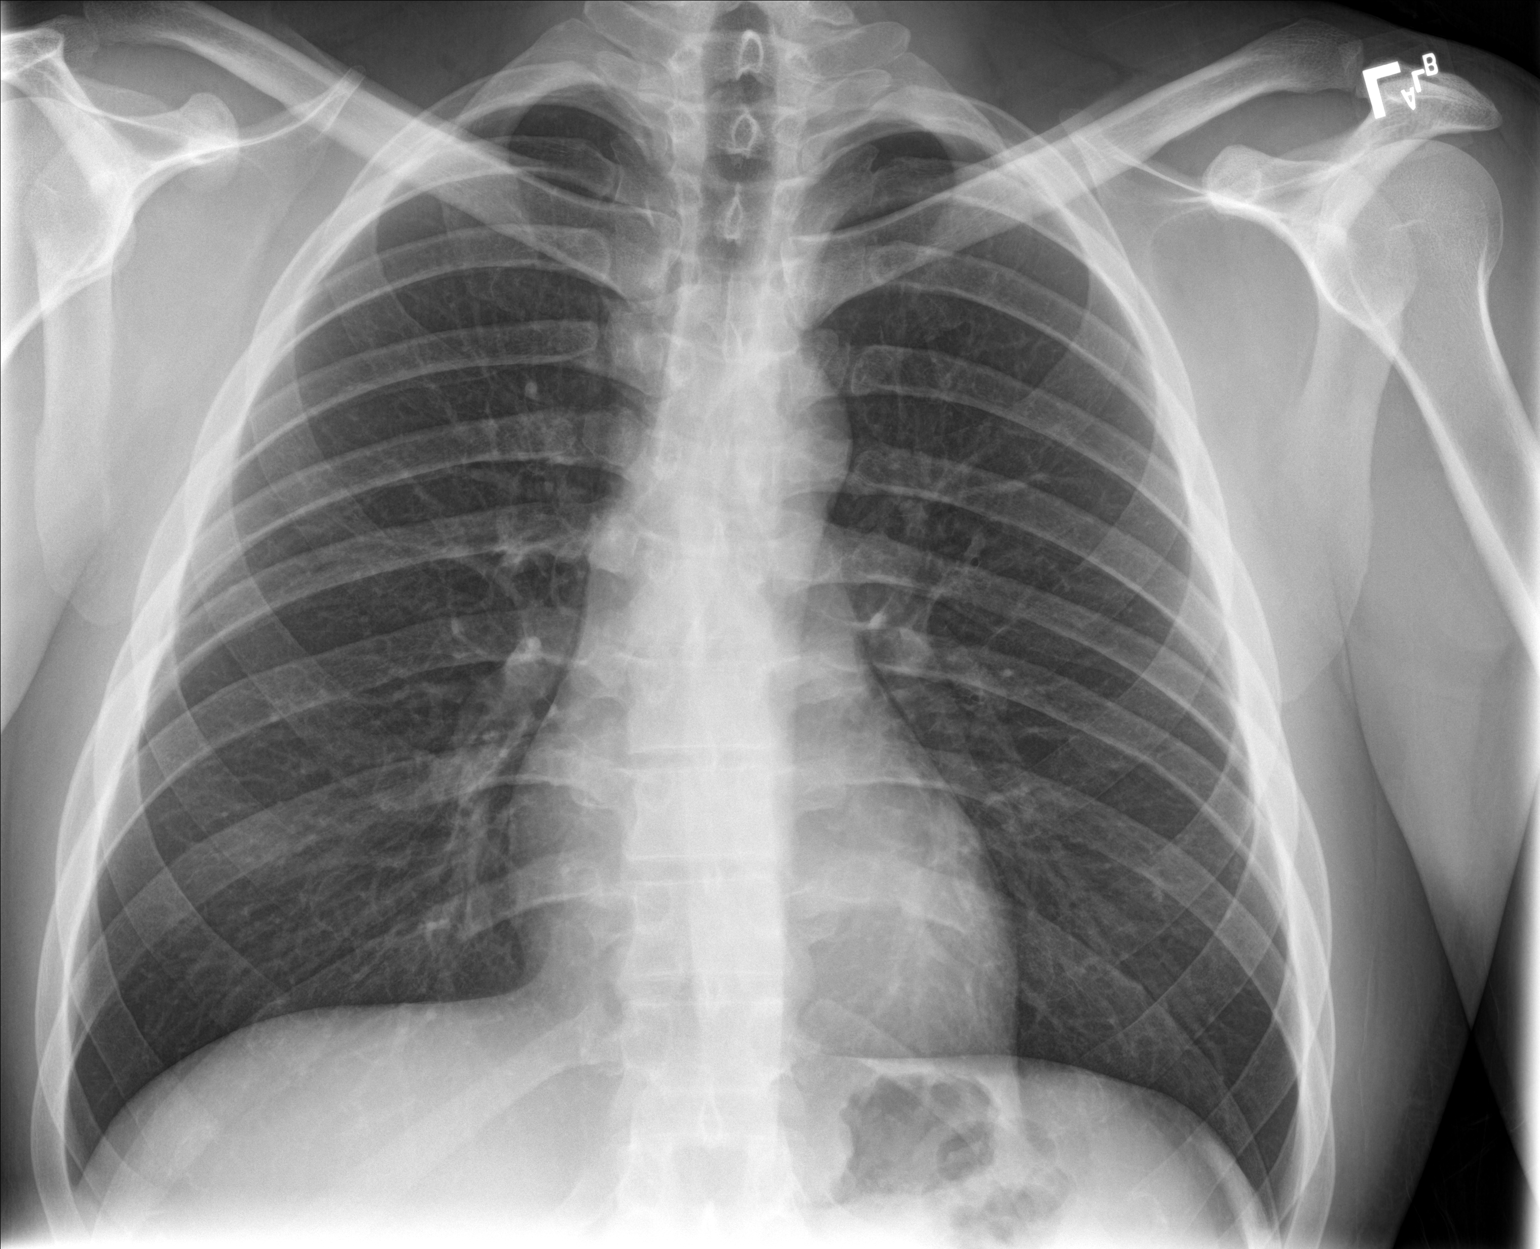

[chest lat]
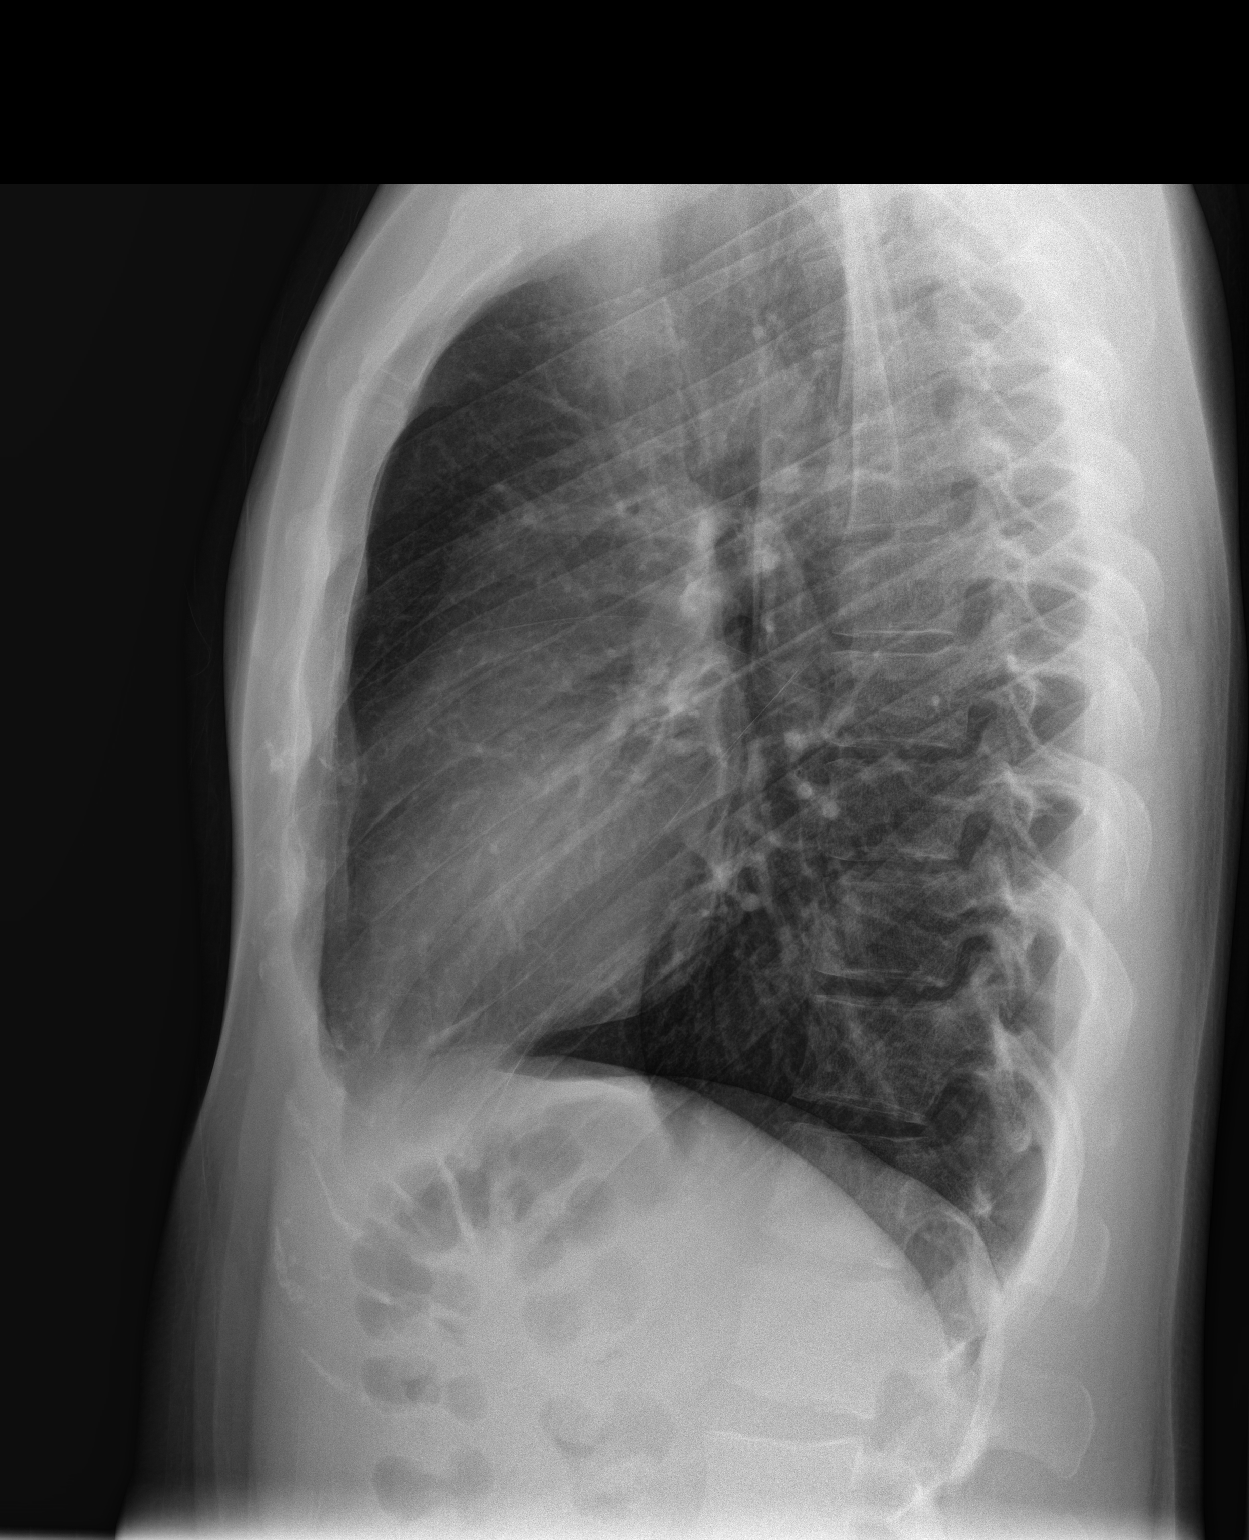

[chest pa (2 of 2)]
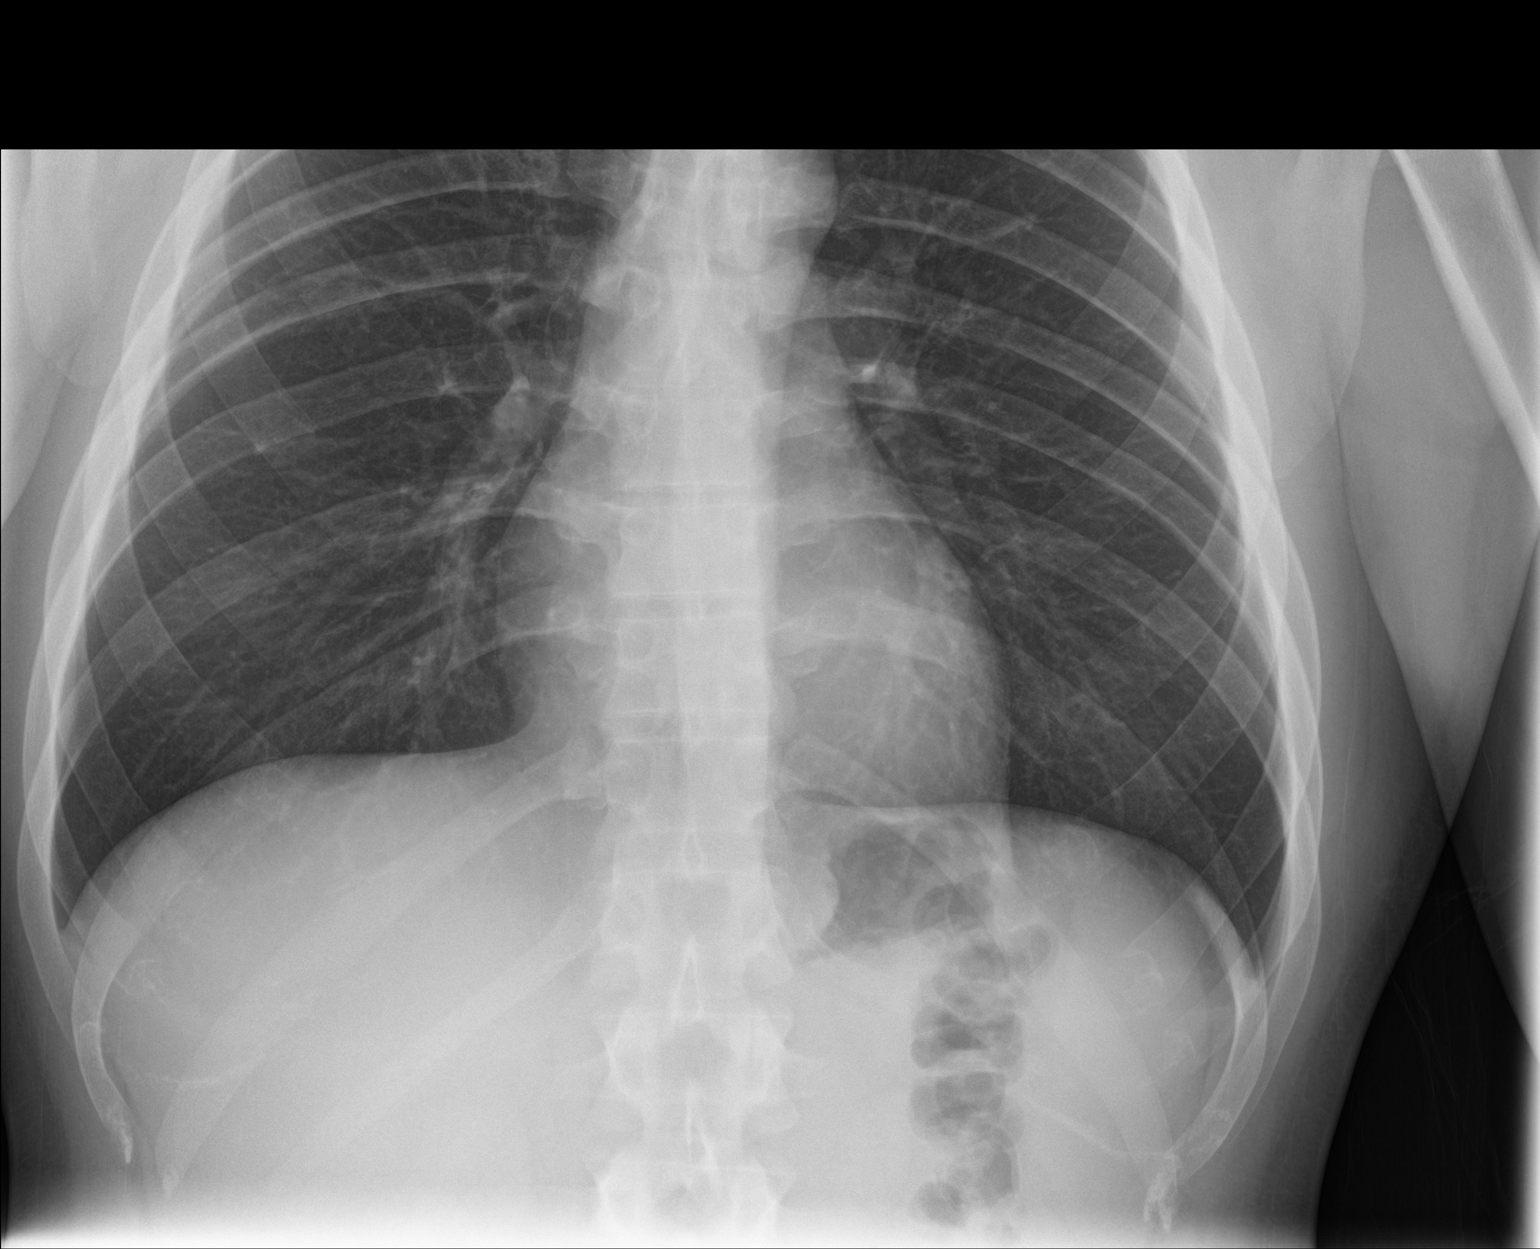

[3 of 3 positions shown; findings below may reference images not displayed]

FINDINGS: Cardiac silhouette and mediastinal contours are within normal
limits. The lungs are clear. No pleural effusion or pneumothorax. No
acute skeletal abnormality.
IMPRESSION: No active cardiopulmonary disease.

## 2023-11-10 ENCOUNTER — Encounter: Payer: Self-pay | Admitting: Physician Assistant
